# Patient Record
Sex: Male | Born: 2000
Health system: Southern US, Community
[De-identification: ages and names within clinical notes are randomized; demographics above are authoritative.]

---
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
## 2018-01-07 DIAGNOSIS — H52213 Irregular astigmatism, bilateral: Secondary | ICD-10-CM | POA: Diagnosis not present

## 2018-07-12 ENCOUNTER — Ambulatory Visit: Payer: BLUE CROSS/BLUE SHIELD | Admitting: Family Medicine

## 2018-07-12 ENCOUNTER — Encounter: Payer: Self-pay | Admitting: Family Medicine

## 2018-07-12 VITALS — BP 123/78 | HR 53 | Temp 97.4°F | Ht 69.0 in | Wt 137.6 lb

## 2018-07-12 DIAGNOSIS — F4322 Adjustment disorder with anxiety: Secondary | ICD-10-CM

## 2018-07-12 DIAGNOSIS — G479 Sleep disorder, unspecified: Secondary | ICD-10-CM | POA: Diagnosis not present

## 2018-07-12 MED ORDER — PAROXETINE HCL ER 12.5 MG PO TB24: 12.5000 mg | ORAL_TABLET | Freq: Every day | ORAL | 1 refills | Status: DC

## 2018-07-12 MED ORDER — TRAZODONE HCL 50 MG PO TABS: 50.0000 mg | ORAL_TABLET | Freq: Every evening | ORAL | 0 refills | Status: DC | PRN

## 2018-07-12 NOTE — Progress Notes (Signed)
Impression and Recommendations:    1. Adjustment disorder with anxious mood   2. Trouble in sleeping     1. Mood Management - Not optimally managed at this time. - Patient agrees to  restart  Paxil today and take medication daily as prescribed.  See med list today. - Patient tolerating meds well without complication.  Denies S-E  - Patient was not taking Paxil as recommended.  Per pt, he was taking the medication PRN, not on a daily basis as advised and prescribed.  - Educated and counseled patient on prudent use of Paxil.  Discussed weaning up on dose of Paxil as patient resumes treatment.  - Reviewed the "spokes of the wheel" of mood and health management.  Stressed the importance of ongoing prudent habits, including regular exercise, appropriate sleep hygiene, healthful dietary habits, and prayer/meditation to relax.  2. Insomnia - Per patient, tried melatonin, but continues having trouble sleeping. - Start with 50 mg tablet of trazodone, one every night before bed.  See med list today.  - Per patient, does not want to get addicted to sleep medication.  - Reviewed that patient can discontinue use of trazodone at any time, especially once his sleep/wake cycles become more regulated.  3. Lifestyle & Preventative Health Maintenance - Advised patient to continue working toward exercising to improve overall mental, physical, and emotional health.  He will cont to ween marijuana use- try to avoid daily use  American Heart Association guidelines for healthy diet, basically Mediterranean diet, and exercise guidelines of 30 minutes 5 days per week or more discussed in detail.  Health counseling performed.  All questions answered.    - Encouraged patient to engage in daily physical activity, especially a formal exercise routine.  Recommended that the patient eventually strive for at least 150 minutes of moderate cardiovascular activity per week according to guidelines established  by the [facility].   - Healthy dietary habits encouraged, including low-carb, and high amounts of lean protein in diet.   - Patient should also consume adequate amounts of water.   Education and routine counseling performed. Handouts provided.  4. Follow-Up - Return in 2-2.5 months to check on progress on Paxil. - Prescriptions provided and refilled today PRN.  - Re-check fasting lab work as recommended. - Otherwise, continue to return for CPE and chronic follow-up as scheduled.   - Patient knows to call in sooner if desired to address acute concerns.   Meds ordered this encounter  Medications  . PARoxetine (PAXIL CR) 12.5 MG 24 hr tablet    Sig: Take 1 tablet (12.5 mg total) by mouth daily. After eating    Dispense:  90 tablet    Refill:  1  . traZODone (DESYREL) 50 MG tablet    Sig: Take 1-2 tablets (50-100 mg total) by mouth at bedtime as needed for sleep.    Dispense:  90 tablet    Refill:  0    Gross side effects, risk and benefits, and alternatives of medications and treatment plan in general discussed with patient.  Patient is aware that all medications have potential side effects and we are unable to predict every side effect or drug-drug interaction that may occur.   Patient will call with any questions prior to using medication if they have concerns.  Expresses verbal understanding and consents to current therapy and treatment regimen.  No barriers to understanding were identified.  Red flag symptoms and signs discussed in detail.  Patient expressed understanding  regarding what to do in case of emergency\urgent symptoms  Please see AVS handed out to patient at the end of our visit for further patient instructions/ counseling done pertaining to today's office visit.   Return for f/up 6=8wks after starting paxil and trazadone -  use EVERY DAY.     Note:  This document was prepared using Dragon voice recognition software and may include unintentional dictation errors.   This  document serves as a record of services personally performed by Thomasene Lot, DO. It was created on her behalf by Peggye Fothergill, a trained medical scribe. The creation of this record is based on the scribe's personal observations and the provider's statements to them.   I have reviewed the above medical documentation for accuracy and completeness and I concur.  Thomasene Lot, DO 07/12/2018 9:10 PM      -------------------------------------------------------------------------------    Subjective:    CC:  Chief Complaint  Patient presents with  . Insomnia    HPI: Ricardo Stark is a 18 y.o. male who presents to [facility] today for follow-up of mood.   These days he is working, "pretty much saving money, waiting for the next thing to show up in my life and what to do next."  Patient does exercise, however, he is currently losing weight.  States he weighed almost 160 lbs, and is now down to 137.  Notes "I just don't eat anything."  Insomnia Purchased melatonin for sleep, but it didn't help him.  "It just didn't work."  Anxiety - Managed on Paxil Was seen last visit for adjustment disorder, fatigue, and trouble sleeping.  Started on Paxil.  Patient was using Paxil PRN, but not as prescribed.  States  "I used 'em, they were great for a little bit, but I don't use 'em anymore."  Says "all it does is make me feel a little bit better."  Confirms it did help to improve his mood, but says he was taking it "every now and then," and was never taking it "every day back to back."  Denies side effects.  Says he felt some stomach pain, and "I wasn't hungry at all" when he first started it.  Patient continues to smoke every day.  Say "I've been really wanting to stop, it's just one of the hardest things to do."  However, "when I do take [the Paxil], I'm not sitting there thinking 'oh man I have to smoke,' because [the Paxil] is helping."    Depression  screen [facility] 2/9 07/12/2018 10/28/2017 10/07/2017  Decreased Interest 1 1 1   Down, Depressed, Hopeless 1 1 1   PHQ - 2 Score 2 2 2   Altered sleeping 3 3 2   Tired, decreased energy 1 1 2   Change in appetite 3 2 1   Feeling bad or failure about yourself  1 1 0  Trouble concentrating 0 0 0  Moving slowly or fidgety/restless 0 1 1  Suicidal thoughts 0 0 0  PHQ-9 Score 10 10 8   Difficult doing work/chores Somewhat difficult Somewhat difficult -     GAD 7 : Generalized Anxiety Score 07/12/2018  Nervous, Anxious, on Edge 1  Control/stop worrying 1  Worry too much - different things 1  Trouble relaxing 1  Restless 0  Easily annoyed or irritable 1  Afraid - awful might happen 0  Total GAD 7 Score 5     Wt Readings from Last 3 Encounters:  07/12/18 137 lb 9.6 oz (62.4 kg) (22 %,  Z= -0.77)*  10/28/17 146 lb (66.2 kg) (40 %, Z= -0.26)*  10/07/17 150 lb 9.6 oz (68.3 kg) (48 %, Z= -0.05)*   * Growth percentiles are based on CDC (Boys, 2-20 Years) data.   BP Readings from Last 3 Encounters:  07/12/18 123/78  10/28/17 114/68  10/07/17 114/62   Pulse Readings from Last 3 Encounters:  07/12/18 (!) 53  10/28/17 (!) 52  10/07/17 73   BMI Readings from Last 3 Encounters:  07/12/18 20.32 kg/m (17 %, Z= -0.97)*  10/28/17 21.56 kg/m (38 %, Z= -0.31)*  10/07/17 22.24 kg/m (48 %, Z= -0.05)*   * Growth percentiles are based on CDC (Boys, 2-20 Years) data.         Patient Care Team    Relationship Specialty Notifications Start End  Thomasene Lotpalski, Miarose Lippert, DO PCP - General Family Medicine  10/07/17      Patient Active Problem List   Diagnosis Date Noted  . Vitamin D deficiency 10/29/2017  . Adjustment disorder with anxious mood 10/28/2017  . Nausea 10/28/2017  . Early satiety 10/28/2017  . Environmental and seasonal allergies 10/07/2017  . Trouble in sleeping 10/07/2017  . Fatigue 10/07/2017  . Family history of diabetes mellitus in father 10/07/2017  . Health education/counseling  10/07/2017    Past Medical history, Surgical history, Family history, Social history, Allergies and Medications have been entered into the medical record, reviewed and changed as needed.    No outpatient medications have been marked as taking for the 07/12/18 encounter (Office Visit) with Thomasene Lotpalski, Salsabeel Gorelick, DO.    Allergies:  No Known Allergies   Review of Systems: Review of Systems: General:   No F/C, wt loss Pulm:   No DIB, SOB, pleuritic chest pain Card:  No CP, palpitations Abd:  No n/v/d or pain Ext:  No inc edema from baseline Psych: no SI/ HI    Objective:   Blood pressure 123/78, pulse (!) 53, temperature (!) 97.4 F (36.3 C), height 5\' 9"  (1.753 m), weight 137 lb 9.6 oz (62.4 kg), SpO2 99 %. Body mass index is 20.32 kg/m. General:  Well Developed, well nourished, appropriate for stated age.  Neuro:  Alert and oriented,  extra-ocular muscles intact  HEENT:  Normocephalic, atraumatic, neck supple, no carotid bruits appreciated  Skin:  no gross rash, warm, pink. Cardiac:  RRR, S1 S2 Respiratory:  ECTA B/L and A/P, Not using accessory muscles, speaking in full sentences- unlabored. Vascular:  Ext warm, no cyanosis apprec.; cap RF less 2 sec. Psych:  No HI/SI, judgement and insight good, Euthymic mood. Full Affect.

## 2018-07-12 NOTE — Patient Instructions (Addendum)
Start with a half tablet of the paroxetine daily for 6 days.  Then if well tolerated from a gastrointestinal standpoint and you have no nausea or feelings like you want to vomit etc., then go take 1 full tablet daily.  Additionally, to help with sleep we have prescribed trazodone.  On the bottle it says 1 to 2 tablets nightly as needed for sleep however, I would like you to start at a lower dose and start slowly.  So please start with 1/2 tablet at night to see how it affects you and you may go up to 2 tablets nightly to help with your sleep pattern and sleep wake cycles.  Also please see the information below for further information to help with sleep     If you have insomnia or difficulty sleeping, this information is for you:  - Avoid caffeinated beverages after lunch,  no alcoholic beverages,  no eating within 2-3 hours of lying down,  avoid exposure to blue light before bed,  avoid daytime naps, and  needs to maintain a regular sleep schedule- go to sleep and wake up around the same time every night.   - Resolve concerns or worries before entering bedroom:  Discussed relaxation techniques with patient and to keep a journal to write down fears\ worries.  I suggested seeing a counselor for CBT.   - Recommend patient meditate or do deep breathing exercises to help relax.   Incorporate the use of white noise machines or listen to "sleep meditation music", or recordings of guided meditations for sleep from YouTube which are free, such as  "guided meditation for detachment from over thinking"  by Ina KickMichael Sealey.        Living With Anxiety  After being diagnosed with an anxiety disorder, you may be relieved to know why you have felt or behaved a certain way. It is natural to also feel overwhelmed about the treatment ahead and what it will mean for your life. With care and support, you can manage this condition and recover from it. How to cope with anxiety Dealing with stress Stress is your  bodys reaction to life changes and events, both good and bad. Stress can last just a few hours or it can be ongoing. Stress can play a major role in anxiety, so it is important to learn both how to cope with stress and how to think about it differently. Talk with your health care provider or a counselor to learn more about stress reduction. He or she may suggest some stress reduction techniques, such as:  Music therapy. This can include creating or listening to music that you enjoy and that inspires you.  Mindfulness-based meditation. This involves being aware of your normal breaths, rather than trying to control your breathing. It can be done while sitting or walking.  Centering prayer. This is a kind of meditation that involves focusing on a word, phrase, or sacred image that is meaningful to you and that brings you peace.  Deep breathing. To do this, expand your stomach and inhale slowly through your nose. Hold your breath for 3-5 seconds. Then exhale slowly, allowing your stomach muscles to relax.  Self-talk. This is a skill where you identify thought patterns that lead to anxiety reactions and correct those thoughts.  Muscle relaxation. This involves tensing muscles then relaxing them.  Choose a stress reduction technique that fits your lifestyle and personality. Stress reduction techniques take time and practice. Set aside 5-15 minutes a day to do them.  Therapists can offer training in these techniques. The training may be covered by some insurance plans. Other things you can do to manage stress include:  Keeping a stress diary. This can help you learn what triggers your stress and ways to control your response.  Thinking about how you respond to certain situations. You may not be able to control everything, but you can control your reaction.  Making time for activities that help you relax, and not feeling guilty about spending your time in this way.  Therapy combined with coping and  stress-reduction skills provides the best chance for successful treatment. Medicines Medicines can help ease symptoms. Medicines for anxiety include:  Anti-anxiety drugs.  Antidepressants.  Beta-blockers.  Medicines may be used as the main treatment for anxiety disorder, along with therapy, or if other treatments are not working. Medicines should be prescribed by a health care provider. Relationships Relationships can play a big part in helping you recover. Try to spend more time connecting with trusted friends and family members. Consider going to couples counseling, taking family education classes, or going to family therapy. Therapy can help you and others better understand the condition. How to recognize changes in your condition Everyone has a different response to treatment for anxiety. Recovery from anxiety happens when symptoms decrease and stop interfering with your daily activities at home or work. This may mean that you will start to:  Have better concentration and focus.  Sleep better.  Be less irritable.  Have more energy.  Have improved memory.  It is important to recognize when your condition is getting worse. Contact your health care provider if your symptoms interfere with home or work and you do not feel like your condition is improving. Where to find help and support: You can get help and support from these sources:  Self-help groups.  Online and [employer].  A trusted spiritual leader.  Couples counseling.  Family education classes.  Family therapy.  Follow these instructions at home:  Eat a healthy diet that includes plenty of vegetables, fruits, whole grains, low-fat dairy products, and lean protein. Do not eat a lot of foods that are high in solid fats, added sugars, or salt.  Exercise. Most adults should do the following: ? Exercise for at least 150 minutes each week. The exercise should increase your heart rate and make you sweat  (moderate-intensity exercise). ? Strengthening exercises at least twice a week.  Cut down on caffeine, tobacco, alcohol, and other potentially harmful substances.  Get the right amount and quality of sleep. Most adults need 7-9 hours of sleep each night.  Make choices that simplify your life.  Take over-the-counter and prescription medicines only as told by your health care provider.  Avoid caffeine, alcohol, and certain over-the-counter cold medicines. These may make you feel worse. Ask your pharmacist which medicines to avoid.  Keep all follow-up visits as told by your health care provider. This is important. Questions to ask your health care provider  Would I benefit from therapy?  How often should I follow up with a health care provider?  How long do I need to take medicine?  Are there any long-term side effects of my medicine?  Are there any alternatives to taking medicine? Contact a health care provider if:  You have a hard time staying focused or finishing daily tasks.  You spend many hours a day feeling worried about everyday life.  You become exhausted by worry.  You start to have headaches, feel tense,  or have nausea.  You urinate more than normal.  You have diarrhea. Get help right away if:  You have a racing heart and shortness of breath.  You have thoughts of hurting yourself or others. If you ever feel like you may hurt yourself or others, or have thoughts about taking your own life, get help right away. You can go to your nearest emergency department or call:  Your local emergency services (911 in the [place]  A suicide crisis helpline, such as the National Suicide Prevention Lifeline at 917-667-1909. This is open 24-hours a day.  Summary  Taking steps to deal with stress can help calm you.  Medicines cannot cure anxiety disorders, but they can help ease symptoms.  Family, friends, and partners can play a big part in helping you recover from an  anxiety disorder. This information is not intended to replace advice given to you by your health care provider. Make sure you discuss any questions you have with your health care provider. Document Released: 07/29/2016 Document Revised: 07/29/2016 Document Reviewed: 07/29/2016 Elsevier Interactive Patient Education  [employer].

## 2018-09-06 ENCOUNTER — Ambulatory Visit: Payer: BLUE CROSS/BLUE SHIELD | Admitting: Family Medicine

## 2018-09-10 ENCOUNTER — Encounter: Payer: Self-pay | Admitting: Family Medicine

## 2018-09-10 ENCOUNTER — Ambulatory Visit: Payer: BLUE CROSS/BLUE SHIELD | Admitting: Family Medicine

## 2018-09-10 VITALS — BP 120/76 | HR 67 | Temp 98.1°F | Ht 69.0 in | Wt 139.4 lb

## 2018-09-10 DIAGNOSIS — G479 Sleep disorder, unspecified: Secondary | ICD-10-CM

## 2018-09-10 DIAGNOSIS — F4322 Adjustment disorder with anxiety: Secondary | ICD-10-CM | POA: Diagnosis not present

## 2018-09-10 MED ORDER — AMITRIPTYLINE HCL 25 MG PO TABS: ORAL_TABLET | ORAL | 0 refills | Status: AC

## 2018-09-10 MED ORDER — PAROXETINE HCL ER 12.5 MG PO TB24: 25.0000 mg | ORAL_TABLET | Freq: Every day | ORAL | 1 refills | Status: AC

## 2018-09-10 NOTE — Progress Notes (Signed)
Impression and Recommendations:    1. Trouble in sleeping   2. Adjustment disorder with anxious mood     1. Mood Management - Adjustment Disorder w/Anxious Mood, Managed on Paxil - Started 12.5 mg of Paxil last appointment. - New prescription provided for two tablets daily, 25 mg. - Advised patient to try out two tablets daily for about 6-8 weeks. - If he notices no change in mood/no improvement, advised patient to reduce back to one tablet.  - Reviewed the "spokes of the wheel" of mood and health management.  Stressed the importance of ongoing prudent habits, including regular exercise, appropriate sleep hygiene, healthful dietary habits, and prayer/meditation to relax.  - Will continue to monitor. - Patient knows to call in if he experiences concerns.  2. Trouble Sleeping - Not Optimally Managed - Not managed on 50 of the trazodone.  Per patient, double dose caused intolerable grogginess. - Discontinue trazodone.  Begin Elavil.   - Take 1-2 tablets every night, about 30 minutes before bed. - Elavil discussed and recommended to patient today to replace trazodone.  - Will continue to monitor.  - Reviewed prudent sleep habits with patient today. - Information on sleep hygiene provided to patient today.  - Advised use of tools such as sleep meditation on YouTube. - Encouraged exercise, meditation/prayer daily.  - Discussed discontinuing electronics/overly stimulating video games at night. - Reviewed the use of blue-light-blocking glasses at night to help restore natural cycles.  - Advised avoidance of use of substances such as caffeine.  - Extensive education provided.  All questions were answered.  3. Lifestyle & Preventative Health Maintenance - Advised patient to continue working toward exercising to improve overall mental, physical, and emotional health.    - American Heart Association guidelines for healthy diet, basically Mediterranean diet, and exercise  guidelines of 30 minutes 5 days per week or more discussed in detail.  - Health counseling performed.  All questions answered.  - Encouraged patient to engage in daily physical activity, especially a formal exercise routine.  Recommended that the patient eventually strive for at least 150 minutes of moderate cardiovascular activity per week according to guidelines established by the [facility].   - Healthy dietary habits encouraged, including low-carb, and high amounts of lean protein in diet.   - Patient should also consume adequate amounts of water.   Education and routine counseling performed. Handouts provided.   Medications Discontinued During This Encounter  Medication Reason  . traZODone (DESYREL) 50 MG tablet   . PARoxetine (PAXIL CR) 12.5 MG 24 hr tablet      Meds ordered this encounter  Medications  . amitriptyline (ELAVIL) 25 MG tablet    Sig: 1-2 tab po q hs sleep    Dispense:  180 tablet    Refill:  0  . PARoxetine (PAXIL CR) 12.5 MG 24 hr tablet    Sig: Take 2 tablets (25 mg total) by mouth daily. After eating    Dispense:  180 tablet    Refill:  1    Gross side effects, risk and benefits, and alternatives of medications and treatment plan in general discussed with patient.  Patient is aware that all medications have potential side effects and we are unable to predict every side effect or drug-drug interaction that may occur.   Patient will call with any questions prior to using medication if they have concerns.  Expresses verbal understanding and consents to current therapy and treatment regimen.  No barriers to  understanding were identified.  Red flag symptoms and signs discussed in detail.  Patient expressed understanding regarding what to do in case of emergency\urgent symptoms  Please see AVS handed out to patient at the end of our visit for further patient instructions/ counseling done pertaining to today's office visit.   Return for 26mo f/up mood/ INc paxil; changed  to elavil ( dc traz);ween personal use of substances.     Note:  This document was prepared using Dragon voice recognition software and may include unintentional dictation errors.   This document serves as a record of services personally performed by Thomasene Loteborah Tim Wilhide, DO. It was created on her behalf by Peggye FothergillKatherine Galloway, a trained medical scribe. The creation of this record is based on the scribe's personal observations and the provider's statements to them.   I have reviewed the above medical documentation for accuracy and completeness and I concur.  Thomasene Loteborah Jolene Guyett, DO 09/12/2018 9:42 PM      ---------------------------------------------------------------------------------------------------------------------------------    Subjective:    CC:  Chief Complaint  Patient presents with  . Follow-up    HPI: Ricardo SciaraJason R Stark is a 18 y.o. male who [facility] follow-up of mood.   Mood Management Started Paxil last appointment.  States "[the Paxil] just makes me feel better, to get through the day."  Comments that he feels like his "shoulders feel at ease."  Anxiety and excess worry is better controlled.  Feels overall that he's about 35% better than before.  Insomnia The trazodone 50 did not work.  Using two tablets helped him sleep, but made him feel groggy in the morning.  He tried melatonin in the past to no success.  Patient states he wants "just a little step up from trazodone."  Confirms that he has trouble going to sleep at a regular time.  Notes he only got 3 hours of sleep last night, because he was nervous about his appointment this morning, among other things.  Played video games and ate food at around 10:30 PM, then started watching movies at 12:00.  He left them playing in the background while he tried to go to sleep.  Exercise & Lifestyle Habits He is exercising about twice to three times per week.  States he's "sort  of" exercising, "not really."   He's still smoking sometimes, but not as much.   [facility] 09/10/2018 07/12/2018 10/28/2017  Decreased Interest 1 1 1   Down, Depressed, Hopeless 1 1 1   PHQ - 2 Score 2 2 2   Altered sleeping 2 3 3   Tired, decreased energy 1 1 1   Change in appetite 0 3 2  Feeling bad or failure about yourself  1 1 1   Trouble concentrating 1 0 0  Moving slowly or fidgety/restless 1 0 1  Suicidal thoughts 0 0 0  PHQ-9 Score 8 10 10   Difficult doing work/chores - Somewhat difficult Somewhat difficult     GAD 7 : Generalized Anxiety Score 09/10/2018 07/12/2018  Nervous, Anxious, on Edge 1 1  Control/stop worrying 1 1  Worry too much - different things 1 1  Trouble relaxing 1 1  Restless 1 0  Easily annoyed or irritable 1 1  Afraid - awful might happen 1 0  Total GAD 7 Score 7 5     Wt Readings from Last 3 Encounters:  09/10/18 139 lb 6.4 oz (63.2 kg) (24 %, Z= -0.70)*  07/12/18 137 lb 9.6 oz (62.4 kg) (  22 %, Z= -0.77)*  10/28/17 146 lb (66.2 kg) (40 %, Z= -0.26)*   * Growth percentiles are based on CDC (Boys, 2-20 Years) data.   BP Readings from Last 3 Encounters:  09/10/18 120/76  07/12/18 123/78  10/28/17 114/68   Pulse Readings from Last 3 Encounters:  09/10/18 67  07/12/18 (!) 53  10/28/17 (!) 52   BMI Readings from Last 3 Encounters:  09/10/18 20.59 kg/m (19 %, Z= -0.88)*  07/12/18 20.32 kg/m (17 %, Z= -0.97)*  10/28/17 21.56 kg/m (38 %, Z= -0.31)*   * Growth percentiles are based on CDC (Boys, 2-20 Years) data.         Patient Care Team    Relationship Specialty Notifications Start End  Thomasene Lotpalski, Junaid Wurzer, DO PCP - General Family Medicine  10/07/17      Patient Active Problem List   Diagnosis Date Noted  . Vitamin D deficiency 10/29/2017  . Adjustment disorder with anxious mood 10/28/2017  . Nausea 10/28/2017  . Early satiety 10/28/2017  . Environmental and seasonal allergies 10/07/2017  . Trouble in sleeping  10/07/2017  . Fatigue 10/07/2017  . Family history of diabetes mellitus in father 10/07/2017  . Health education/counseling 10/07/2017    Past Medical history, Surgical history, Family history, Social history, Allergies and Medications have been entered into the medical record, reviewed and changed as needed.    Current Meds  Medication Sig  . PARoxetine (PAXIL CR) 12.5 MG 24 hr tablet Take 2 tablets (25 mg total) by mouth daily. After eating  . [DISCONTINUED] PARoxetine (PAXIL CR) 12.5 MG 24 hr tablet Take 1 tablet (12.5 mg total) by mouth daily. After eating  . [DISCONTINUED] traZODone (DESYREL) 50 MG tablet Take 1-2 tablets (50-100 mg total) by mouth at bedtime as needed for sleep.    Allergies:  No Known Allergies   Review of Systems: Review of Systems: General:   No F/C, wt loss Pulm:   No DIB, SOB, pleuritic chest pain Card:  No CP, palpitations Abd:  No n/v/d or pain Ext:  No inc edema from baseline Psych: no SI/ HI    Objective:   Blood pressure 120/76, pulse 67, temperature 98.1 F (36.7 C), height 5\' 9"  (1.753 m), weight 139 lb 6.4 oz (63.2 kg), SpO2 99 %. Body mass index is 20.59 kg/m. General:  Well Developed, well nourished, appropriate for stated age.  Neuro:  Alert and oriented,  extra-ocular muscles intact  HEENT:  Normocephalic, atraumatic, neck supple, no carotid bruits appreciated  Skin:  no gross rash, warm, pink. Cardiac:  RRR, S1 S2 Respiratory:  ECTA B/L and A/P, Not using accessory muscles, speaking in full sentences- unlabored. Vascular:  Ext warm, no cyanosis apprec.; cap RF less 2 sec. Psych:  No HI/SI, judgement and insight good, Euthymic mood. Full Affect.

## 2018-09-10 NOTE — Patient Instructions (Addendum)
Please look up information online about "sleep hygiene ".        If you have insomnia or difficulty sleeping, this information is for you:  - Avoid caffeinated beverages after lunch,  no alcoholic beverages,  no eating within 2-3 hours of lying down,  avoid exposure to blue light before bed,  avoid daytime naps, and  needs to maintain a regular sleep schedule- go to sleep and wake up around the same time every night.   - Resolve concerns or worries before entering bedroom:  Discussed relaxation techniques with patient and to keep a journal to write down fears\ worries.  I suggested seeing a counselor for CBT.   - Recommend patient meditate or do deep breathing exercises to help relax.   Incorporate the use of white noise machines or listen to "sleep meditation music", or recordings of guided meditations for sleep from YouTube which are free, such as  "guided meditation for detachment from over thinking"  by Ina Kick.

## 2018-12-14 ENCOUNTER — Ambulatory Visit: Payer: BLUE CROSS/BLUE SHIELD | Admitting: Family Medicine

## 2019-02-09 ENCOUNTER — Ambulatory Visit: Payer: BC Managed Care – PPO | Admitting: Family Medicine

## 2019-02-28 ENCOUNTER — Ambulatory Visit: Payer: BC Managed Care – PPO | Admitting: Adult Health

## 2019-02-28 ENCOUNTER — Other Ambulatory Visit: Payer: Self-pay

## 2019-02-28 ENCOUNTER — Encounter: Payer: Self-pay | Admitting: Adult Health

## 2019-02-28 ENCOUNTER — Telehealth: Payer: Self-pay | Admitting: Family Medicine

## 2019-02-28 ENCOUNTER — Ambulatory Visit: Payer: BC Managed Care – PPO

## 2019-02-28 VITALS — BP 115/71 | HR 84 | Temp 98.2°F | Ht 69.0 in | Wt 148.7 lb

## 2019-02-28 DIAGNOSIS — S40851A Superficial foreign body of right upper arm, initial encounter: Secondary | ICD-10-CM

## 2019-02-28 DIAGNOSIS — Z23 Encounter for immunization: Secondary | ICD-10-CM

## 2019-02-28 DIAGNOSIS — S40021A Contusion of right upper arm, initial encounter: Secondary | ICD-10-CM

## 2019-02-28 DIAGNOSIS — T148XXA Other injury of unspecified body region, initial encounter: Secondary | ICD-10-CM | POA: Insufficient documentation

## 2019-02-28 NOTE — Progress Notes (Addendum)
Subjective:    Patient ID: Ricardo Stark, male    DOB: 2001-01-12, 18 y.o.   MRN: [id number]  HPI:  Mr. Ricardo Stark presents with a contusion on RUE. He was emptying a trash bin of empty glass beer bottles and one of the bottles bounced off the bin, shattered and struck his arm- injury occurred 5 days ago. He denies pain at rest, will reports a very slight dull ache (1/10) when he fully extends his RUE. He is R hand dominant. He has not used any OTC remedies or applied ice. He is her "b/c my mother made me come". He is not on anti-coagulants  Tetanus Updated today  Patient Care Team    Relationship Specialty Notifications Start End  Ricardo Stark, Deborah, DO PCP - General Family Medicine  10/07/17     Patient Active Problem List   Diagnosis Date Noted  . Contusion 02/28/2019  . Vitamin D deficiency 10/29/2017  . Adjustment disorder with anxious mood 10/28/2017  . Nausea 10/28/2017  . Early satiety 10/28/2017  . Environmental and seasonal allergies 10/07/2017  . Trouble in sleeping 10/07/2017  . Fatigue 10/07/2017  . Family history of diabetes mellitus in father 10/07/2017  . Health education/counseling 10/07/2017     History reviewed. No pertinent past medical history.   History reviewed. No pertinent surgical history.   Family History  Problem Relation Age of Onset  . Hyperlipidemia Mother   . Depression Father   . Diabetes Father   . Hyperlipidemia Father   . Hypertension Father      Social History   Substance and Sexual Activity  Drug Use No     Social History   Substance and Sexual Activity  Alcohol Use None     Social History   Tobacco Use  Smoking Status Never Smoker  Smokeless Tobacco Never Used     Outpatient Encounter Medications as of 02/28/2019  Medication Sig  . amitriptyline (ELAVIL) 25 MG tablet 1-2 tab po q hs sleep  . PARoxetine (PAXIL CR) 12.5 MG 24 hr tablet Take 2 tablets (25 mg total) by mouth daily. After eating   No  facility-administered encounter medications on file as of 02/28/2019.     Allergies: Patient has no known allergies.  Body mass index is 21.96 kg/m.  Blood pressure 115/71, pulse 84, temperature 98.2 F (36.8 C), temperature source Oral, height 5\' 9"  (1.753 m), weight 148 lb 11.2 oz (67.4 kg), SpO2 98 %.     Review of Systems  Constitutional: Negative for activity change, appetite change, chills, diaphoresis, fatigue, fever and unexpected weight change.  Eyes: Negative for visual disturbance.  Respiratory: Negative for cough, chest tightness, shortness of breath, wheezing and stridor.   Cardiovascular: Negative for chest pain, palpitations and leg swelling.  Endocrine: Negative for cold intolerance, heat intolerance, polydipsia, polyphagia and polyuria.  Musculoskeletal: Positive for arthralgias. Negative for gait problem, joint swelling, myalgias, neck pain and neck stiffness.  Skin: Positive for color change and wound. Negative for pallor and rash.  Neurological: Negative for dizziness and headaches.  Hematological: Negative for adenopathy. Does not bruise/bleed easily.       Objective:   Physical Exam Vitals signs and nursing note reviewed.  Constitutional:      General: He is not in acute distress.    Appearance: Normal appearance. He is normal weight. He is not ill-appearing, toxic-appearing or diaphoretic.  HENT:     Head: Normocephalic and atraumatic.  Cardiovascular:     Rate and Rhythm: Normal  rate and regular rhythm.     Pulses: Normal pulses.     Heart sounds: Normal heart sounds. No murmur. No friction rub. No gallop.   Pulmonary:     Effort: Pulmonary effort is normal. No respiratory distress.     Breath sounds: No stridor. No wheezing, rhonchi or rales.  Chest:     Chest wall: No tenderness.  Musculoskeletal: Normal range of motion.        General: Tenderness and signs of injury present. No swelling.  Skin:    General: Skin is warm and dry.     Capillary  Refill: Capillary refill takes less than 2 seconds.     Coloration: Skin is not pale.     Findings: Bruising present. No erythema, laceration, petechiae, rash or wound.          Comments: RUE- brachialis muscle- 0.5cm mobile, soft mass noted. 3cm circular area of bruising noted   Neurological:     Mental Status: He is alert.  Psychiatric:        Mood and Affect: Mood normal.        Behavior: Behavior normal.        Thought Content: Thought content normal.        Judgment: Judgment normal.           Assessment & Plan:   1. Foreign body of upper arm, right, initial encounter   2. Contusion of right upper arm, initial encounter   3. Need for Tdap vaccination     Contusion Xray- IMPRESSION: Limited examination demonstrating no radiopaque foreign body. Tetanus updated today. Xray does not show any foreign body present. If symptoms do not improve in 2 weeks, please call clinic. Continue to social distance and wear a mask when in public.    FOLLOW-UP:  Return if symptoms worsen or fail to improve.

## 2019-02-28 NOTE — Assessment & Plan Note (Addendum)
Xray- IMPRESSION: Limited examination demonstrating no radiopaque foreign body. Tetanus updated today. Xray does not show any foreign body present. If symptoms do not improve in 2 weeks, please call clinic. Continue to social distance and wear a mask when in public.

## 2019-02-28 NOTE — Patient Instructions (Addendum)
Contusion A contusion is a deep bruise. This is a result of an injury that causes bleeding under the skin. Symptoms of bruising include pain, swelling, and discolored skin. The skin may turn blue, purple, or yellow. Follow these instructions at home: Managing pain, stiffness, and swelling You may use RICE. This stands for:  Resting.  Icing.  Compression, or putting pressure.  Elevating, or raising the injured area. To follow this method, do these actions:  Rest the injured area.  If told, put ice on the injured area. ? Put ice in a plastic bag. ? Place a towel between your skin and the bag. ? Leave the ice on for 20 minutes, 2-3 times per day.  If told, put light pressure (compression) on the injured area using an elastic bandage. Make sure the bandage is not too tight. If the area tingles or becomes numb, remove it and put it back on as told by your doctor.  If possible, raise (elevate) the injured area above the level of your heart while you are sitting or lying down.  General instructions  Take over-the-counter and prescription medicines only as told by your doctor.  Keep all follow-up visits as told by your doctor. This is important. Contact a doctor if:  Your symptoms do not get better after several days of treatment.  Your symptoms get worse.  You have trouble moving the injured area. Get help right away if:  You have very bad pain.  You have a loss of feeling (numbness) in a hand or foot.  Your hand or foot turns pale or cold. Summary  A contusion is a deep bruise. This is a result of an injury that causes bleeding under the skin.  Symptoms of bruising include pain, swelling, and discolored skin. The skin may turn blue, purple, or yellow.  This condition is treated with rest, ice, compression, and elevation. This is also called RICE. You may be given over-the-counter medicines for pain.  Contact a doctor if you do not feel better, or you feel worse. Get  help right away if you have very bad pain, have lost feeling in a hand or foot, or the area turns pale or cold. This information is not intended to replace advice given to you by your health care provider. Make sure you discuss any questions you have with your health care provider. Document Released: 01/21/2008 Document Revised: 03/26/2018 Document Reviewed: 03/26/2018 Elsevier Patient Education  2020 [employer].  Tetanus updated today. Xray does not show any foreign body present. If symptoms do not improve in 2 weeks, please call clinic. Continue to social distance and wear a mask when in public.

## 2019-03-01 DIAGNOSIS — S40851A Superficial foreign body of right upper arm, initial encounter: Secondary | ICD-10-CM | POA: Diagnosis not present

## 2019-05-12 ENCOUNTER — Other Ambulatory Visit: Payer: Self-pay | Admitting: Family Medicine

## 2019-05-12 DIAGNOSIS — F4322 Adjustment disorder with anxiety: Secondary | ICD-10-CM

## 2019-05-12 DIAGNOSIS — G479 Sleep disorder, unspecified: Secondary | ICD-10-CM

## 2020-01-20 IMAGING — DX RIGHT HUMERUS - 2+ VIEW
2 series · 2 of 2 positions shown · non-contrast
Comparison: None.

CLINICAL DATA: Right upper arm foreign body.

EXAM:
RIGHT HUMERUS - 2+ VIEW

[humerus ap]
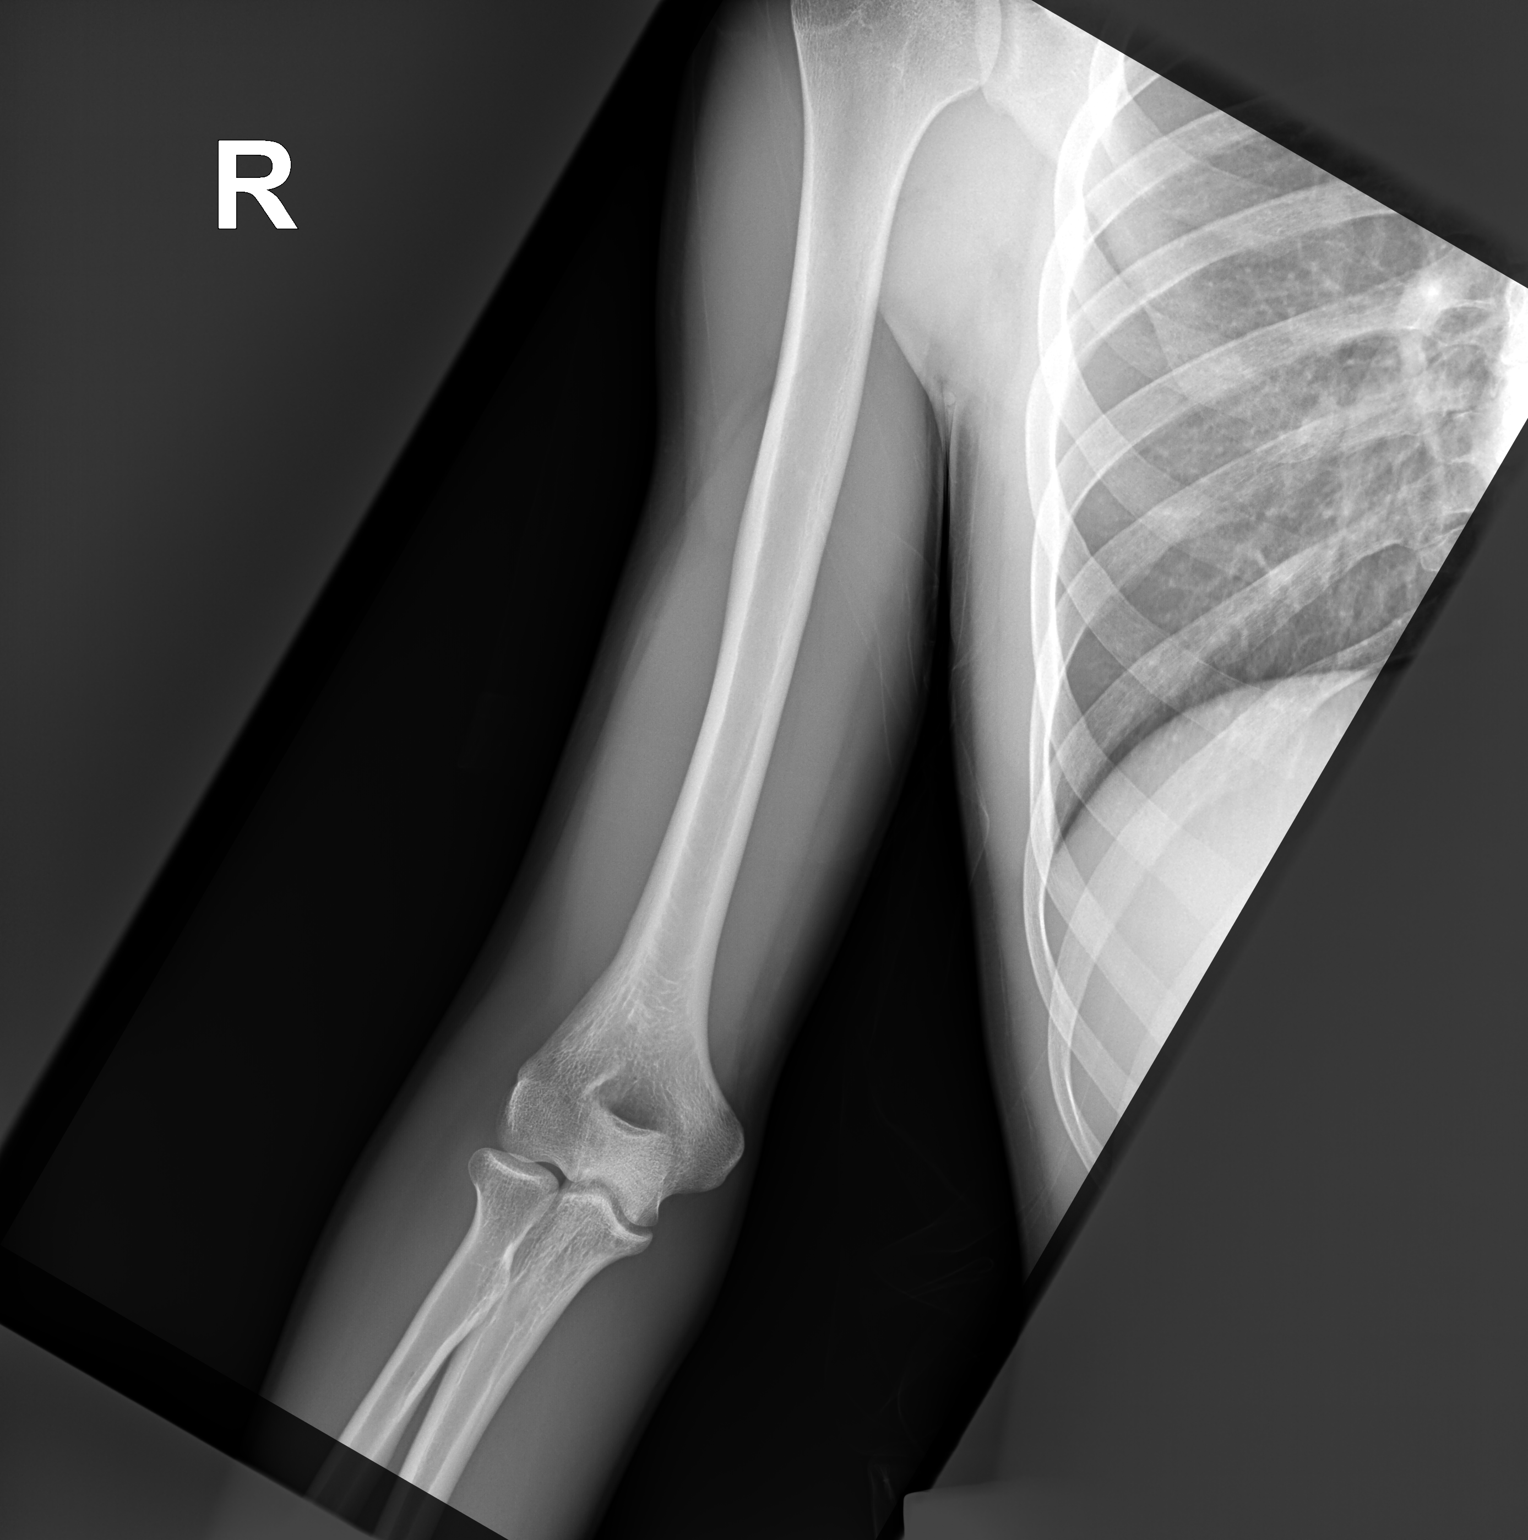

[humerus lat]
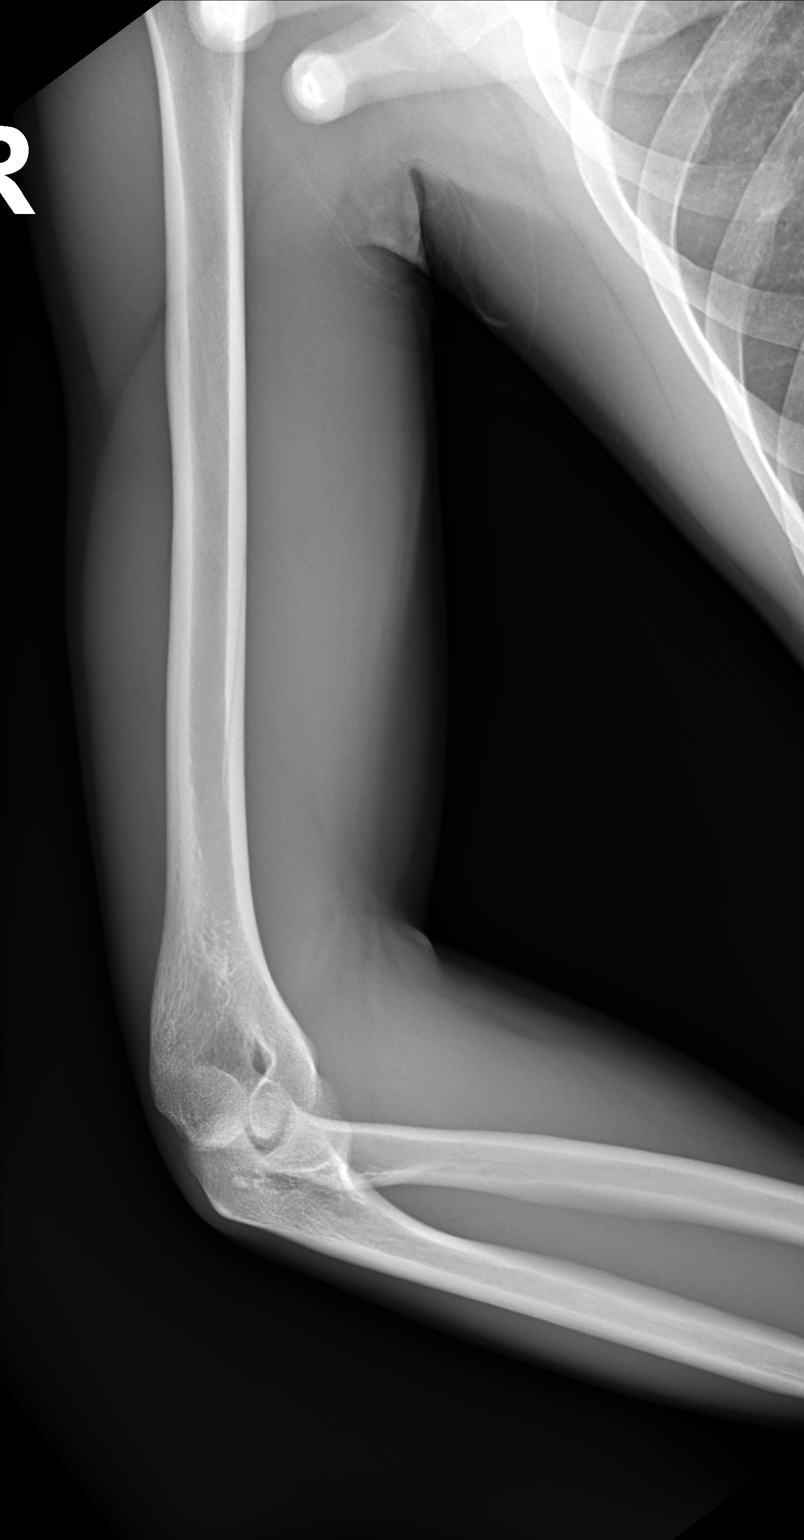

[2 of 2 positions shown; findings below may reference images not displayed]

FINDINGS: AP and lateral views of the right upper arm do not include the upper
humeral head in the frontal projection and the humeral head and neck
in the oblique lateral projection. No fracture or dislocation is
seen. There is no radiopaque foreign body in the included portions
of the arm.
IMPRESSION: Limited examination demonstrating no radiopaque foreign body.

## 2020-08-06 DIAGNOSIS — H52213 Irregular astigmatism, bilateral: Secondary | ICD-10-CM | POA: Diagnosis not present
